# Patient Record
Sex: Male | Born: 1995 | Race: Black or African American | Hispanic: No | Marital: Single | Smoking: Former smoker
Health system: Southern US, Community
[De-identification: ages and names within clinical notes are randomized; demographics above are authoritative.]

## PROBLEM LIST (undated history)

## (undated) DIAGNOSIS — J45909 Unspecified asthma, uncomplicated: Secondary | ICD-10-CM

---
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
## 2016-08-09 ENCOUNTER — Ambulatory Visit (HOSPITAL_COMMUNITY)
Admission: EM | Admit: 2016-08-09 | Discharge: 2016-08-09 | Disposition: A | Discharge status: Home / Self Care | Payer: No Typology Code available for payment source | Attending: Family Medicine | Admitting: Family Medicine

## 2016-08-09 ENCOUNTER — Encounter (HOSPITAL_COMMUNITY): Payer: Self-pay | Admitting: Emergency Medicine

## 2016-08-09 DIAGNOSIS — G44209 Tension-type headache, unspecified, not intractable: Secondary | ICD-10-CM

## 2016-08-09 DIAGNOSIS — R51 Headache: Secondary | ICD-10-CM

## 2016-08-09 DIAGNOSIS — S161XXA Strain of muscle, fascia and tendon at neck level, initial encounter: Secondary | ICD-10-CM

## 2016-08-09 DIAGNOSIS — R519 Headache, unspecified: Secondary | ICD-10-CM

## 2016-08-09 MED ORDER — DEXAMETHASONE SODIUM PHOSPHATE 10 MG/ML IJ SOLN
INTRAMUSCULAR | Status: AC
  Filled 2016-08-09: qty 1

## 2016-08-09 MED ORDER — KETOROLAC TROMETHAMINE 60 MG/2ML IM SOLN
INTRAMUSCULAR | Status: AC
  Filled 2016-08-09: qty 2

## 2016-08-09 MED ORDER — DEXAMETHASONE SODIUM PHOSPHATE 10 MG/ML IJ SOLN
10.0000 mg | Freq: Once | INTRAMUSCULAR | Status: AC
  Administered 2016-08-09: 10 mg via INTRAMUSCULAR

## 2016-08-09 MED ORDER — KETOROLAC TROMETHAMINE 30 MG/ML IJ SOLN
45.0000 mg | Freq: Once | INTRAMUSCULAR | Status: AC
  Administered 2016-08-09: 45 mg via INTRAMUSCULAR

## 2016-08-09 MED ORDER — MELOXICAM 15 MG PO TABS: 15.0000 mg | ORAL_TABLET | Freq: Every day | ORAL | 0 refills | Status: AC

## 2016-08-09 NOTE — Discharge Instructions (Signed)
Your neurological exam is completely intact. The most likely explanation for your headache is muscle tension headache associated with muscle tenderness and pain to the right side of your neck that extends toward the back of the head and just above the ear. This inflammatory condition of the muscles is likely to be the cause of your headache. You are receiving 2 injections for this today and will be prescribed a medicine that she will take once a day as needed for pain and inflammation. Apply heat to the area of pain and perform stretches of the neck as discussed and demonstrated. No physical activity or rapid turning of the head for the next few days until it is better.

## 2016-08-09 NOTE — ED Provider Notes (Signed)
CSN: [id number]     Arrival date & time 08/09/16  1316 History   First MD Initiated Contact with Patient 08/09/16 1422     Chief Complaint  Patient presents with  . Headache   (Consider location/radiation/quality/duration/timing/severity/associated sxs/prior Treatment) 21 year old male complaining of a daily headache for 4 days. Constant and at times gets worse to the point where he had called in a migraine headache. He has never been diagnosed with migraines. He states that one to 2 times a day to become very intense. He is able to sleep but it occurs prior to sleep and upon awakening. He denies any known trauma, fall, lung trauma, are mobile accidents or other injuries it may have caused head injury. Much later during the interview he states that he had been playing tennis prior to the onset of a headache and he was feeling neck pain after. Arrest. The neck pain is located to the right cervical musculature primarily over the splenius capitis and trapezius. He has had occasional photophobia and sonophobia. He denies problems with vision, speech, hearing, swallowing, concentration, orientation, diplopia or other similar problems. No focal paresthesias or weakness. Occasionally he will take Aleve and it does help substantially for a few hours.      Past Medical History:  Diagnosis Date  . Asthma    History reviewed. No pertinent surgical history. History reviewed. No pertinent family history. Social History  Substance Use Topics  . Smoking status: Former Games developer  . Smokeless tobacco: Never Used  . Alcohol use Yes    Review of Systems  Constitutional: Positive for activity change. Negative for fatigue and fever.  HENT: Negative.   Eyes: Negative.   Respiratory: Negative.   Cardiovascular: Negative.   Gastrointestinal: Negative.   Genitourinary: Negative.   Musculoskeletal: Positive for neck pain. Negative for arthralgias, back pain, joint swelling and neck stiffness.  Skin: Negative.    Neurological: Positive for headaches. Negative for dizziness, tremors, seizures, syncope, facial asymmetry, speech difficulty, weakness, light-headedness and numbness.  Hematological: Does not bruise/bleed easily.  Psychiatric/Behavioral: Negative.   All other systems reviewed and are negative.   Allergies  Patient has no known allergies.  Home Medications   Prior to Admission medications   Not on File   Meds Ordered and Administered this Visit   Medications  dexamethasone (DECADRON) injection 10 mg (not administered)  ketorolac (TORADOL) 30 MG/ML injection 45 mg (not administered)    BP 112/66 (BP Location: Left Arm)   Pulse (!) 58   Temp 97.9 F (36.6 C) (Oral)   Resp 16   SpO2 99%  No data found.   Physical Exam  Constitutional: He is oriented to person, place, and time. He appears well-developed and well-nourished. No distress.  HENT:  Head: Normocephalic and atraumatic.  Nose: Nose normal.  Mouth/Throat: Oropharynx is clear and moist. No oropharyngeal exudate.  Bilateral TMs are normal. No hemotympanum. Swollen reflexes normal. Uvula and tongue are midline.  Eyes: Conjunctivae and EOM are normal. Pupils are equal, round, and reactive to light. Right eye exhibits no discharge. Left eye exhibits no discharge.  Neck: Normal range of motion. Neck supple. No tracheal deviation present.  Cardiovascular: Normal rate, regular rhythm, normal heart sounds and intact distal pulses.   Pulmonary/Chest: Effort normal and breath sounds normal. No respiratory distress.  Musculoskeletal: Normal range of motion. He exhibits no edema.  Lymphadenopathy:    He has no cervical adenopathy.  Neurological: He is alert and oriented to person, place, and time. He has  normal strength. He displays no atrophy and no tremor. No cranial nerve deficit or sensory deficit. He exhibits normal muscle tone. He displays no seizure activity. Coordination and gait normal. GCS eye subscore is 4. GCS  verbal subscore is 5. GCS motor subscore is 6.  Reflex Scores:      Patellar reflexes are 2+ on the right side and 2+ on the left side. Finger to nose completely normal rapid and smooth. No dysdiadochokinesia Upper and lower extremity strength 5 over 5 and symmetric.  Skin: Skin is warm and dry. He is not diaphoretic.  Psychiatric: Judgment normal.  Nursing note and vitals reviewed.   Urgent Care Course   Clinical Course     Procedures (including critical care time)  Labs Review Labs Reviewed - No data to display  Imaging Review No results found.   Visual Acuity Review  Right Eye Distance:   Left Eye Distance:   Bilateral Distance:    Right Eye Near:   Left Eye Near:    Bilateral Near:         MDM   1. Headache disorder   2. Acute non intractable tension-type headache   3. Strain of muscle, fascia and tendon at neck level, initial encounter    Your neurological exam is completely intact. The most likely explanation for your headache is muscle tension headache associated with muscle tenderness and pain to the right side of your neck that extends toward the back of the head and just above the ear. This inflammatory condition of the muscles is likely to be the cause of your headache. You are receiving 2 injections for this today and will be prescribed a medicine that she will take once a day as needed for pain and inflammation. Apply heat to the area of pain and perform stretches of the neck as discussed and demonstrated. No physical activity or rapid turning of the head for the next few days until it is better. Meds ordered this encounter  Medications  . dexamethasone (DECADRON) injection 10 mg  . ketorolac (TORADOL) 30 MG/ML injection 45 mg  . meloxicam (MOBIC) 15 MG tablet    Sig: Take 1 tablet (15 mg total) by mouth daily. Prn headache    Dispense:  14 tablet    Refill:  0    Order Specific Question:   Supervising Provider    Answer:   Linna HoffKINDL, JAMES D [[id number]]        Hayden Rasmussenavid Patryk Conant, NP 08/09/16 [phone]

## 2016-08-09 NOTE — ED Triage Notes (Signed)
[facility] a [facility] x 5 days. The patient denied any hx of migraine headaches.
